# Patient Record
Sex: Female | Born: 2005 | Race: Black or African American | Hispanic: No | Marital: Single | Smoking: Never smoker
Health system: Southern US, Community
[De-identification: ages and names within clinical notes are randomized; demographics above are authoritative.]

## PROBLEM LIST (undated history)

## (undated) DIAGNOSIS — R519 Headache, unspecified: Secondary | ICD-10-CM

## (undated) DIAGNOSIS — R7303 Prediabetes: Secondary | ICD-10-CM

## (undated) DIAGNOSIS — E669 Obesity, unspecified: Secondary | ICD-10-CM

## (undated) DIAGNOSIS — F329 Major depressive disorder, single episode, unspecified: Secondary | ICD-10-CM

## (undated) DIAGNOSIS — L309 Dermatitis, unspecified: Secondary | ICD-10-CM

## (undated) DIAGNOSIS — I10 Essential (primary) hypertension: Secondary | ICD-10-CM

## (undated) DIAGNOSIS — F419 Anxiety disorder, unspecified: Secondary | ICD-10-CM

## (undated) DIAGNOSIS — E739 Lactose intolerance, unspecified: Secondary | ICD-10-CM

## (undated) DIAGNOSIS — M549 Dorsalgia, unspecified: Secondary | ICD-10-CM

## (undated) DIAGNOSIS — F32A Depression, unspecified: Secondary | ICD-10-CM

## (undated) DIAGNOSIS — E282 Polycystic ovarian syndrome: Secondary | ICD-10-CM

## (undated) DIAGNOSIS — K5900 Constipation, unspecified: Secondary | ICD-10-CM

## (undated) HISTORY — DX: Constipation, unspecified: K59.00

## (undated) HISTORY — DX: Anxiety disorder, unspecified: F41.9

## (undated) HISTORY — DX: Dorsalgia, unspecified: M54.9

## (undated) HISTORY — DX: Lactose intolerance, unspecified: E73.9

## (undated) HISTORY — DX: Essential (primary) hypertension: I10

## (undated) HISTORY — DX: Dermatitis, unspecified: L30.9

## (undated) HISTORY — DX: Depression, unspecified: F32.A

## (undated) HISTORY — DX: Prediabetes: R73.03

## (undated) HISTORY — DX: Polycystic ovarian syndrome: E28.2

---
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
## 2024-01-02 ENCOUNTER — Ambulatory Visit (INDEPENDENT_AMBULATORY_CARE_PROVIDER_SITE_OTHER): Admitting: Family Medicine

## 2024-01-02 ENCOUNTER — Encounter (INDEPENDENT_AMBULATORY_CARE_PROVIDER_SITE_OTHER): Payer: Self-pay

## 2024-01-15 ENCOUNTER — Encounter (INDEPENDENT_AMBULATORY_CARE_PROVIDER_SITE_OTHER): Payer: Self-pay | Admitting: Family Medicine

## 2024-01-15 ENCOUNTER — Ambulatory Visit (INDEPENDENT_AMBULATORY_CARE_PROVIDER_SITE_OTHER): Admitting: Family Medicine

## 2024-01-15 VITALS — BP 116/71 | HR 93 | Temp 98.6°F | Ht 65.0 in | Wt 321.0 lb

## 2024-01-15 DIAGNOSIS — F39 Unspecified mood [affective] disorder: Secondary | ICD-10-CM | POA: Diagnosis not present

## 2024-01-15 DIAGNOSIS — Z6841 Body Mass Index (BMI) 40.0 and over, adult: Secondary | ICD-10-CM

## 2024-01-15 DIAGNOSIS — R0602 Shortness of breath: Secondary | ICD-10-CM | POA: Diagnosis not present

## 2024-01-15 DIAGNOSIS — E559 Vitamin D deficiency, unspecified: Secondary | ICD-10-CM

## 2024-01-15 DIAGNOSIS — K5909 Other constipation: Secondary | ICD-10-CM | POA: Diagnosis not present

## 2024-01-15 DIAGNOSIS — F502 Bulimia nervosa, unspecified: Secondary | ICD-10-CM

## 2024-01-15 DIAGNOSIS — Z8659 Personal history of other mental and behavioral disorders: Secondary | ICD-10-CM

## 2024-01-15 DIAGNOSIS — R5383 Other fatigue: Secondary | ICD-10-CM | POA: Diagnosis not present

## 2024-01-15 DIAGNOSIS — R7303 Prediabetes: Secondary | ICD-10-CM

## 2024-01-15 DIAGNOSIS — Z1331 Encounter for screening for depression: Secondary | ICD-10-CM

## 2024-01-15 DIAGNOSIS — F5089 Other specified eating disorder: Secondary | ICD-10-CM | POA: Diagnosis not present

## 2024-01-15 NOTE — Progress Notes (Signed)
 Judy King, D.O.  [facility], [facility] Specializing in Clinical Bariatric Medicine Office located at: [address]  [place], [place]  [address]   Bariatric Medicine Visit  Dear Judy Eleanor GAILS, MD   Thank you for referring Judy King to our clinic today for evaluation.  We performed a consultation to discuss her options for treatment and educate the patient on her disease state.  The following note includes my evaluation and treatment recommendations.   Please do not hesitate to reach out to me directly if you have any further concerns.   Assessment and Plan:   Orders Placed This Encounter  Procedures   VITAMIN D  25 Hydroxy (Vit-D Deficiency, Fractures)   TSH   T4, free   T3   Lipid panel   Insulin , random   Hemoglobin A1c   Folate   Comprehensive metabolic panel with GFR   Vitamin B12   CBC with Differential/Platelet   EKG 12-Lead    FOR THE DISEASE OF OBESITY:  BMI 50.0-59.9, adult (HCC) current 53.4 Morbid obesity Starting [facility]) 53.4 Assessment & Plan: [name] is currently in the action stage of change. As such, her goal is to start our weight management plan.  She has agreed to implement the Category 3 plan  with Breakfast options and only 200 snack calories.  Of note, she is currently at camp and may have some difficulties following her meal plan.    Behavioral Intervention We discussed the following today: begin to work on maintaining a reduced calorie state, getting the recommended amount of protein, incorporating whole foods, making healthy choices, staying well hydrated and practicing mindfulness when eating.  Additional resources provided today: Handout on CAT 3 meal plan and Handout on CAT 3-4 breakfast options  Evidence-based interventions for health behavior change were utilized today including the discussion of self monitoring techniques, problem-solving barriers and SMART goal setting techniques.    Pt will specifically work on trying to  measure her portions of veggies and lean proteins.   Recommended Physical Activity Goals [name] has been advised to work up to 150 minutes of moderate intensity aerobic activity a week and strengthening exercises 2-3 times per week for cardiovascular health, weight loss maintenance and preservation of muscle mass.   She has agreed to maintain current level of activity.    Pharmacotherapy We both agreed to begin nutritional and behavioral strategies   ASSOCIATED CONDITIONS ADDRESSED TODAY:   Fatigue Assessment & Plan: Judy King does feel that her weight is causing her energy to be lower than it should be. Fatigue may be related to obesity, depression or many other causes. she does not appear to have any red flag symptoms and this appears to most likely be related to her current lifestyle habits and dietary intake.  Labs will be ordered and reviewed with her at their next office visit in two weeks.  Epworth sleepiness scale is 11 and appears to not be within normal limits. Judy King admits to daytime somnolence and admits to waking up still tired. Patient has morning headaches 3 times per week. Hiedi generally gets about 8-10 hours of sleep per night, and states that she has generally restful sleep. Snoring is present.  She had a baseline polysomnogram on 09/22/2021.   Impression and recommendations of Dr.Dohmeier:   1. No evidence of Obstructive Sleep Apnea (OSA), nor of Periodic Limb Movement Disorder (PLMD).   - Snoring treatment can be achieved by avoiding the supine position and losing weight. - A dental device can be  used to treat snoring, but medical coverage may be difficult as no apnea diagnosis was established.   She has yet to obtain a dental device and was encouraged to today. Recommend she obtain a body pillow so she can avoid the supine position.    ECG: Performed and reviewed/ interpreted independently.  Normal sinus rhythm, rate 85 bpm; reassuring without any  acute abnormalities, will continue to monitor for symptoms     Shortness of breath on exertion Assessment & Plan: Judy King does feel that she gets out of breath more easily than she used to when she exercises and seems to be worsening over time with weight gain.  This has gotten worse recently. Judy King denies shortness of breath at rest or orthopnea. Pt denies chest pain, dizziness, heart palpitations, or excessive diaphoresis or nausea with activity.  This is not new and is ongoing.  Judy King's shortness of breath appears to be obesity related and exercise induced, as they do not appear to have any red flag symptoms/ concerns today.  Also, this condition appears to be related to a state of poor cardiovascular conditioning   Obtain labs today and will be reviewed with her at their next office visit in two weeks.  Indirect Calorimeter completed today to help guide our dietary regimen. It shows a VO2 of 308 and a REE of 2131.  Her calculated basal metabolic rate is 7733 thus her resting energy expenditure is worse than expected.  Patient agreed to work on weight loss at this time.  As Taje progresses through our weight loss program, we will gradually increase exercise as tolerated to treat her current condition.   If Judy King follows our recommendations and loses 5-10% of their weight without improvement of her shortness of breath or if at any time, symptoms become more concerning, they agree to urgently follow up with their PCP/ specialist for further consideration/ evaluation.   Kenedy verbalizes agreement with this plan.     Mood disorder (HCC) - emotional eating/ Depression Screen  Assessment & Plan:    01/15/2024    7:25 AM 07/22/2022   10:13 AM 12/09/2019    5:31 PM  PHQ9 SCORE ONLY  PHQ-9 Total Score 12 15 5    She endorses having a history of anxiety and depression diagnosed around 6th grade. Was on Fluoxetine   but  later transitioned to Bupropion .. Denies any SI/HI. Mood is  stable today. She endorses emotional eating tendencies specifically when stressed, sad, bored, angry, as a reward, and to help comfort herself. She had a therapist in the past which helped her overcome bulimia nervosa but is no longer seeing one.   Continue Bupropion  300 mg daily. Encouraged to reestablish in therapy for general well-being. Consider Dr.Barker referral in the future if needed. Initiate prudent nutritional plan which can support mood.     Bulimia nervosa, unspecified severity Assessment & Plan: States she was diagnosed with bulimia by a medical doctor in 6th grade. States she was able to overcome bulimia by working with therapists and after starting medications (see Mood disorder section for medications). No incidents since 8th grade. Overall she has no concerns today.     Chronic constipation Assessment & Plan: Due to her history of lactose intolerance, she endorses experiencing constipation when she has a lot of milk products. Continue to avoid lactose products. Start fiber rich prudent nutritional plan.     Prediabetes Assessment & Plan: Lab Results  Component Value Date   HGBA1C 5.7 (H) 06/15/2023   HGBA1C 5.6 06/24/2022  HGBA1C 5.2 12/09/2019    She endorses having a history of pre-diabetes diagnosed around 7th grade. In the past, she was on Metformin  per endocrinology however it was discontinued due to side effects (e.g nausea).   Begin working on nutrition plan to decrease simple carbohydrates, increase lean proteins and exercise to promote weight loss and improve glycemic control and prevent progression to T2DM. Recheck labs.     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 She was on prescription strength VD for a few months at that time. Currently on no supplementation. Will  recheck levels today and provide further guidance at next OV.    FOLLOW UP:   Follow up in 2 weeks. She was informed of the importance of frequent follow up visits to maximize her success with intensive lifestyle modifications for her multiple health conditions.  Caci Orren is aware that we will review all of her lab results at our next visit.  She is aware that if anything is critical/ life threatening with the results, we will be contacting her via MyChart prior to the office visit to discuss management.     Chief Complaint:   OBESITY Suly Vukelich (MR# [id number]) is a pleasant 18 y.o. female who presents for evaluation and treatment of obesity and related comorbidities. Current BMI is Body mass index is 53.42 kg/m. Lasheba Stevens has been struggling with her weight for many years and has been unsuccessful in either losing weight, maintaining weight loss, or reaching her healthy weight goal.  Martena Emanuele is currently in the action stage of change and ready to dedicate time achieving and maintaining a healthier weight. Susi Goslin is interested in becoming our patient and working on intensive lifestyle modifications including (but not limited to) diet and exercise for weight loss.  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  Patient is single and does not have children. She lives with her father and grand-mother.   Gained weight in 2020 because of stress.   Desires to be 180 lbs at some point.   Recently lost 30 lbs in 3 months and kept off weight for 7 months.   Exercise regimen: 150 minutes 2-3 days/week of cardio or wt lifting.   Diets tried in past: Gluten Free, Lactose Free, no carbs/sweets, keto. Nothing worked well for her in the long term.   Eats fast food or take out 3 times per week.   Craves: [place]  [address] and [employer].   Snacks on gummy bears and fruit snacks after dinner.   Skips lunch or dinner 3x/week.    Drinks several caloric beverages including regular sodas 2-3 times per week, juice, sweet tea, fruit smoothies, and protein drinks. Rarely drinks alcohol.   Worst food habit: late night eating after dinner    Subjective:   This is the patient's first visit at [facility].  The patient's NEW PATIENT PACKET that they filled out prior to today's office visit was reviewed at length and information from that paperwork was included within the following office visit note.    Included in the packet: current and past health history, medications, allergies, ROS, gynecologic history (women only), surgical history, family history, social history, weight history, weight loss surgery history (for those that have had  weight loss surgery), nutritional evaluation, mood and food questionnaire along with a depression screening (PHQ9) on all patients, an Epworth questionnaire, sleep habits questionnaire, patient life and health improvement goals questionnaire. These will all be scanned into the patient's chart under the media tab.   Review of Systems: Please refer to new patient packet scanned into media. Pertinent positives were addressed with patient today.  Reviewed by clinician on day of visit: allergies, medications, problem list, medical history, surgical history, family history, social history, and previous encounter notes.  During the visit, I independently reviewed the patient's EKG, bioimpedance scale results, and indirect calorimeter results. I used this information to tailor a meal plan for the patient that will help Lorane Molt to lose weight and will improve her obesity-related conditions going forward.  I performed a medically necessary appropriate examination and/or evaluation. I discussed the assessment and treatment plan with the patient. The patient was provided an opportunity to ask questions and all were answered. The patient agreed with the plan and demonstrated an  understanding of the instructions. Labs were ordered today (unless patient declined them) and will be reviewed with the patient at our next visit unless more critical results need to be addressed immediately. Clinical information was updated and documented in the EMR.    Objective:   PHYSICAL EXAM: Blood pressure 116/71, pulse 93, temperature 98.6 F (37 C), height 5' 5 (1.651 m), weight (!) 321 lb (145.6 kg), SpO2 98%. Body mass index is 53.42 kg/m.  General: Well Developed, well nourished, and in no acute distress.  HEENT: Normocephalic, atraumatic; EOMI, sclerae are anicteric. Skin: Warm and dry, good turgor Chest:  Normal excursion, shape, no gross ABN Respiratory: No conversational dyspnea; speaking in full sentences NeuroM-Sk:  Normal gross ROM * 4 extremities  Psych: A and O *3, insight adequate, mood- full   Anthropometric Measurements Height: 5' 5 (1.651 m) Weight: (!) 321 lb (145.6 kg) BMI (Calculated): 53.42 Weight at Last Visit: 0lb Weight Lost Since Last Visit: 0lb Weight Gained Since Last Visit: 0lb Starting Weight: 321lb Total Weight Loss (lbs): 0 lb (0 kg) Peak Weight: 347lb Waist Measurement : 51 inches   Body Composition  Body Fat %: 53.6 % Fat Mass (lbs): 172 lbs Muscle Mass (lbs): 141.6 lbs Visceral Fat Rating : 17   Other Clinical Data RMR: 2131 Fasting: Yes Labs: Yes Today's Visit #: 1 Starting Date: 01/15/24 Comments: First Visit    DIAGNOSTIC DATA REVIEWED:  BMET    Component Value Date/Time   NA 139 06/15/2023 1112   K 4.3 06/15/2023 1112   CL 104 06/15/2023 1112   CO2 28 06/15/2023 1112   GLUCOSE 90 06/15/2023 1112   BUN 10 06/15/2023 1112   CREATININE 0.64 06/15/2023 1112   CALCIUM 9.7 06/15/2023 1112   GFRNONAA NOT CALCULATED 07/11/2020 0306   Lab Results  Component Value Date   HGBA1C 5.7 (H) 06/15/2023   HGBA1C 5.2 12/09/2019   No results found for: INSULIN  No results found for: TSH CBC    Component Value  Date/Time   WBC 9.2 06/15/2023 1112   RBC 4.47 06/15/2023 1112   HGB 12.0 06/15/2023 1112   HCT 37.7 06/15/2023 1112   PLT 520 (H) 06/15/2023 1112   MCV 84.3 06/15/2023 1112   MCH 26.8 06/15/2023 1112   MCHC 31.8 06/15/2023 1112   RDW 14.1 06/15/2023 1112   Iron Studies No results found for: IRON, TIBC, FERRITIN, IRONPCTSAT Lipid Panel     Component Value Date/Time  CHOL 179 (H) 06/15/2023 1112   TRIG 114 (H) 06/15/2023 1112   HDL 48 06/15/2023 1112   CHOLHDL 3.7 06/15/2023 1112   LDLCALC 109 06/15/2023 1112   Hepatic Function Panel     Component Value Date/Time   PROT 7.1 06/15/2023 1112   AST 19 06/15/2023 1112   ALT 25 06/15/2023 1112   BILITOT 0.2 06/15/2023 1112   No results found for: TSH Nutritional Lab Results  Component Value Date   VD25OH 30 06/15/2023   VD25OH 38 06/24/2022   VD25OH 16 (L) 12/09/2019    Attestation Statements:   I, Special Puri , acting as a Stage manager for Judy Jenkins, DO., have compiled all relevant documentation for today's office visit on behalf of Judy Jenkins, DO, while in the presence of [employer], DO.  I have spent 61 minutes in the care of the patient today including 51 minutes face-to-face assessing and reviewing listed medical problems above as outlined in office visit note, providing nutritional and behavioral counseling as outlined in obesity care plan, independently interpreting results and goals of care, see listed medical problems, and discussing biometric information and progress.   I have reviewed the above documentation for accuracy and completeness, and I agree with the above. Judy JINNY King, D.O.  The 21st Century Cures Act was signed into law in 2016 which includes the topic of electronic health records.  This provides immediate access to information in MyChart.  This includes consultation notes, operative notes, office notes, lab results and pathology reports.  If you have any questions  about what you read please let us  know at your next visit so we can discuss your concerns and take corrective action if need be.  We are right here with you.

## 2024-01-16 LAB — COMPREHENSIVE METABOLIC PANEL WITH GFR
ALT: 20 IU/L (ref 0–32)
AST: 21 IU/L (ref 0–40)
Albumin: 4.1 g/dL (ref 4.0–5.0)
Alkaline Phosphatase: 77 IU/L (ref 42–106)
BUN/Creatinine Ratio: 14 (ref 9–23)
BUN: 10 mg/dL (ref 6–20)
Bilirubin Total: <0.2 mg/dL (ref 0.0–1.2)
CO2: 16 mmol/L — ABNORMAL LOW (ref 20–29)
Calcium: 9.7 mg/dL (ref 8.7–10.2)
Chloride: 105 mmol/L (ref 96–106)
Creatinine, Ser: 0.71 mg/dL (ref 0.57–1.00)
Globulin, Total: 2.8 g/dL (ref 1.5–4.5)
Glucose: 84 mg/dL (ref 70–99)
Potassium: 4.4 mmol/L (ref 3.5–5.2)
Sodium: 140 mmol/L (ref 134–144)
Total Protein: 6.9 g/dL (ref 6.0–8.5)
eGFR: 126 mL/min/1.73

## 2024-01-16 LAB — FOLATE: Folate: >20 ng/mL (ref >3.0)

## 2024-01-16 LAB — CBC WITH DIFFERENTIAL/PLATELET
Basophils Absolute: 0 x10E3/uL (ref 0.0–0.2)
Basos: 0 %
EOS (ABSOLUTE): 0.2 x10E3/uL (ref 0.0–0.4)
Eos: 2 %
Hematocrit: 39.4 % (ref 34.0–46.6)
Hemoglobin: 11.9 g/dL (ref 11.1–15.9)
Immature Grans (Abs): 0 x10E3/uL (ref 0.0–0.1)
Immature Granulocytes: 0 %
Lymphocytes Absolute: 1.7 x10E3/uL (ref 0.7–3.1)
Lymphs: 21 %
MCH: 26.8 pg (ref 26.6–33.0)
MCHC: 30.2 g/dL — ABNORMAL LOW (ref 31.5–35.7)
MCV: 89 fL (ref 79–97)
Monocytes Absolute: 0.7 x10E3/uL (ref 0.1–0.9)
Monocytes: 8 %
Neutrophils Absolute: 5.7 x10E3/uL (ref 1.4–7.0)
Neutrophils: 69 %
Platelets: 475 x10E3/uL — ABNORMAL HIGH (ref 150–450)
RBC: 4.44 x10E6/uL (ref 3.77–5.28)
RDW: 14 % (ref 11.7–15.4)
WBC: 8.4 x10E3/uL (ref 3.4–10.8)

## 2024-01-16 LAB — INSULIN, RANDOM: INSULIN: 37.7 u[IU]/mL — ABNORMAL HIGH (ref 2.6–24.9)

## 2024-01-16 LAB — T3: T3, Total: 140 ng/dL (ref 71–180)

## 2024-01-16 LAB — HEMOGLOBIN A1C
Est. average glucose Bld gHb Est-mCnc: 105 mg/dL
Hgb A1c MFr Bld: 5.3 % (ref 4.8–5.6)

## 2024-01-16 LAB — TSH: TSH: 1.96 u[IU]/mL (ref 0.450–4.500)

## 2024-01-16 LAB — LIPID PANEL
Chol/HDL Ratio: 4.2 ratio (ref 0.0–4.4)
Cholesterol, Total: 200 mg/dL — ABNORMAL HIGH (ref 100–169)
HDL: 48 mg/dL
LDL Chol Calc (NIH): 135 mg/dL — ABNORMAL HIGH (ref 0–109)
Triglycerides: 94 mg/dL — ABNORMAL HIGH (ref 0–89)
VLDL Cholesterol Cal: 17 mg/dL (ref 5–40)

## 2024-01-16 LAB — VITAMIN D 25 HYDROXY (VIT D DEFICIENCY, FRACTURES): Vit D, 25-Hydroxy: 32.5 ng/mL (ref 30.0–100.0)

## 2024-01-16 LAB — T4, FREE: Free T4: 0.87 ng/dL — ABNORMAL LOW (ref 0.93–1.60)

## 2024-01-16 LAB — VITAMIN B12: Vitamin B-12: 310 pg/mL (ref 232–1245)

## 2024-01-29 ENCOUNTER — Ambulatory Visit (INDEPENDENT_AMBULATORY_CARE_PROVIDER_SITE_OTHER): Admitting: Family Medicine

## 2024-01-29 ENCOUNTER — Encounter (INDEPENDENT_AMBULATORY_CARE_PROVIDER_SITE_OTHER): Payer: Self-pay | Admitting: Family Medicine

## 2024-01-29 VITALS — BP 116/76 | HR 73 | Temp 98.4°F | Ht 65.0 in | Wt 323.0 lb

## 2024-01-29 DIAGNOSIS — E559 Vitamin D deficiency, unspecified: Secondary | ICD-10-CM

## 2024-01-29 DIAGNOSIS — E038 Other specified hypothyroidism: Secondary | ICD-10-CM

## 2024-01-29 DIAGNOSIS — Z6841 Body Mass Index (BMI) 40.0 and over, adult: Secondary | ICD-10-CM

## 2024-01-29 DIAGNOSIS — R7303 Prediabetes: Secondary | ICD-10-CM

## 2024-01-29 DIAGNOSIS — E538 Deficiency of other specified B group vitamins: Secondary | ICD-10-CM

## 2024-01-29 DIAGNOSIS — E782 Mixed hyperlipidemia: Secondary | ICD-10-CM

## 2024-01-29 NOTE — Progress Notes (Signed)
 Judy King, D.O.  ABFM, ABOM Clinical Bariatric Medicine Physician  Office located at: [address] [place], [place]  [address]   Assessment and Plan:   *** No orders of the defined types were placed in this encounter.   There are no discontinued medications.   No orders of the defined types were placed in this encounter.   FOR THE DISEASE OF OBESITY:  BMI 50.0-59.9, adult (HCC) current 53.8 Morbid obesity Starting [facility]) 53.4 Assessment & Plan: Since last office visit on 01/15/2024 patient's muscle mass has increased by 1.2  lbs. Fat mass has increased by 1  lbs. Total body water is 118 lbs.  Counseling done on how various foods will affect these numbers and how to maximize success  Total lbs lost to date: + 2 lbs Total weight loss percentage to date: + 0.62 %   Recommended Dietary Goals Kytzia is currently in the action stage of change. As such, her goal is to continue weight management plan.  She has agreed to begin journaling 1550-1650 cal and 120++  grams protein daily with the CAT 3 MP (only 200 snack cal) as a guide.    Behavioral Intervention We discussed the following today: increasing lean protein intake to established goals, increasing water intake , work on tracking and journaling calories using tracking application, and focusing on food with a 10:1 ratio of calories: grams of protein  Additional resources provided today: Handout on protein content of various foods, Handout on general tips for eating healthy, Handout on I.R and Pre-DM education.   Evidence-based interventions for health behavior change were utilized today including the discussion of self monitoring techniques, problem-solving barriers and SMART goal setting techniques.  Regarding patient's less desirable eating habits and patterns, we employed the technique of small changes.   Pt will specifically work on: n/a   Recommended Physical Activity Goals Carrieanne has been advised to  work up to 150 minutes of moderate intensity aerobic activity a week and strengthening exercises 2-3 times per week for cardiovascular health, weight loss maintenance and preservation of muscle mass.   She has agreed to : Continue current level of physical activity    Pharmacotherapy Continue with current nutritional and behavioral strategies   ASSOCIATED CONDITIONS ADDRESSED TODAY:   Mixed hyperlipidemia - new onset Assessment & Plan: Lab Results  Component Value Date   CHOL 200 (H) 01/15/2024   HDL 48 01/15/2024   LDLCALC 135 (H) 01/15/2024   TRIG 94 (H) 01/15/2024   CHOLHDL 4.2 01/15/2024   Her Triglycerides are slightly elevated at 94. LDL is also elevated at 135. HDL is WNL.   - Diagnosis was reviewed with the patient and education was provided.  - Continue to work on nutrition plan -decreasing simple carbohydrates, increasing lean proteins, decreasing saturated fats and cholesterol , avoiding trans fats and exercise as able to promote weight loss and improve lipids.     Prediabetes Assessment & Plan: Lab Results  Component Value Date   HGBA1C 5.3 01/15/2024   HGBA1C 5.7 (H) 06/15/2023   HGBA1C 5.6 06/24/2022   INSULIN  37.7 (H) 01/15/2024   Lab Results  Component Value Date   FOLATE >20.0 01/15/2024   Lab Results  Component Value Date   CREATININE 0.71 01/15/2024   BUN 10 01/15/2024   NA 140 01/15/2024   K 4.4 01/15/2024   CL 105 01/15/2024   CO2 16 (L) 01/15/2024      Component Value Date/Time   PROT 6.9 01/15/2024 0916  ALBUMIN 4.1 01/15/2024 0916   AST 21 01/15/2024 0916   ALT 20 01/15/2024 0916   ALKPHOS 77 01/15/2024 0916   BILITOT <0.2 01/15/2024 0916    Lab Results  Component Value Date   WBC 8.4 01/15/2024   HGB 11.9 01/15/2024   HCT 39.4 01/15/2024   MCV 89 01/15/2024   PLT 475 (H) 01/15/2024   Was on Metformin  for pre-DM per endocrinology in the past for about a year. She experienced side effects (nausea and vomiting) for 3-4 months  and so the Metformin  was discontinued. These side effects were felt despite her eating healthy.   Lab wise, her fasting insulin  is roughly 7 times normal. Blood counts do not appear to show evidence of anemia. Hemoglobin A1c, folate, kidney and liver function are WNL.  - Patient aware of disease state and risk of progression.  - Educational handout on pre-dm and I.R provided.  - Begin journaling intake with an emphasis on decreasing simple carbs/ sugars; increasing fiber and proteins  - Ongoing reduction in adipose tissue will improve insulin  resistance.  - CO2 will likely improve with increased cardiovascular fitness.      Subclinical hypothyroidism - new onset Assessment & Plan: Lab Results  Component Value Date   TSH 1.960 01/15/2024   FREET4 0.87 (L) 01/15/2024   T3TOTAL 140 01/15/2024   Her T3 and TSH are normal. Free T4 was slightly low at 0.87.   - Diagnosis was reviewed with the patient. - Recommend she f/up with PCP and or endocrinology for further care.     B12 deficiency - new onset Assessment & Plan: Lab Results  Component Value Date   VITAMINB12 310 01/15/2024   B12 level is 310, not at goal of over 500.    - Diagnosis was reviewed with the patient and education was provided.  - Will hold off on B12 supplementation for now.  - Continue  prudent nutritional plan and focus on b12 rich foods such as lean red meats; poultry; eggs; seafood; beans, peas, and lentils; nuts and seeds; and soy products - Recheck 3-4 months.     Vitamin D  deficiency Assessment & Plan: Lab Results  Component Value Date   VD25OH 32.5 01/15/2024   VD25OH 30 06/15/2023   VD25OH 38 06/24/2022   She endorses having a history of Vit D deficiency diagnosed around 5th grade. She was on prescription strength Vit D for a few months at that time. Currently on no supplementation. Her Vitamin D  levels are not at goal of 50 to 70.   - Discussed the importance of vitamin D  to the patient's  health and well-being as well as to their ability to lose weight.  - Pt is agreeable to starting Ergocaliferol 50,000 units weekly.  - Recheck 3-4 months.     FOLLOW UP:   Return 02/12/2024 at 8:20 AM. She was informed of the importance of frequent follow up visits to maximize her success with intensive lifestyle modifications for her multiple health conditions.   Subjective:   Chief complaint: Obesity Anjenette is here to discuss her progress with her obesity treatment plan. She is on the Category 3 Plan with Breakfast Options and only 200 snack calories and states she is following her eating plan approximately 25 % of the time. She states she is walking 60 minutes 5 days per week.   Interval History:  Marianna Cid is here today for her first follow-up office visit since starting the program with us . Since last OV, pt is  up 2 lbs. She felt the meal plan was a huge jump for her. She does not drink milk and dislikes certain foods like cottage cheese. Did not use her snack calories. Of note, she is currently working at an [employer] Sun-Fri and sometimes skips meals due to her very busy schedule. She has 3 weeks left at camp before returning to [employer].   Pharmacotherapy that aid with weight loss: Was on Metformin  for pre-DM in the past for about a year. She experienced side effects (nausea and vomiting) for 3-4 months and so the Metformin  was discontinued. These side effects were felt despite her eating healthy.    Review of Systems:  Pertinent positives were addressed with patient today.   Reviewed by clinician on day of visit: allergies, medications, problem list, medical history, surgical history, family history, social history, and previous encounter notes.  Weight Summary and Biometrics   Weight Lost Since Last Visit: 0lb  Weight Gained Since Last Visit: 2lb   Vitals Temp: 98.4 F (36.9 C) BP: 116/76 Pulse Rate: 73 SpO2: 98 %   Anthropometric Measurements Height:  5' 5 (1.651 m) Weight: (!) 323 lb (146.5 kg) BMI (Calculated): 53.75 Weight at Last Visit: 321lb Weight Lost Since Last Visit: 0lb Weight Gained Since Last Visit: 2lb Starting Weight: 321lb Total Weight Loss (lbs): 0 lb (0 kg) Peak Weight: 347lb Waist Measurement : 51 inches   Body Composition  Body Fat %: 53.5 % Fat Mass (lbs): 173 lbs Muscle Mass (lbs): 142.8 lbs Total Body Water (lbs): 118 lbs Visceral Fat Rating : 17   Other Clinical Data RMR: 2131 Fasting: No Labs: no Today's Visit #: 2 Starting Date: 01/15/24 Comments: Cat 3    Objective:   PHYSICAL EXAM:  Blood pressure 116/76, pulse 73, temperature 98.4 F (36.9 C), height 5' 5 (1.651 m), weight (!) 323 lb (146.5 kg), SpO2 98%. Body mass index is 53.75 kg/m.  General: she is overweight, cooperative and in no acute distress.   HEENT: EOMI, sclerae are anicteric. Lungs: Normal breathing effort, no conversational dyspnea. M-Sk:  Normal gross ROM * 4 extremities  PSYCH: Has normal mood, affect and thought process. Neurologic: No gross sensory or motor deficits. Well developed, A and O * 3  DIAGNOSTIC DATA REVIEWED:  BMET    Component Value Date/Time   NA 140 01/15/2024 0916   K 4.4 01/15/2024 0916   CL 105 01/15/2024 0916   CO2 16 (L) 01/15/2024 0916   GLUCOSE 84 01/15/2024 0916   GLUCOSE 90 06/15/2023 1112   BUN 10 01/15/2024 0916   CREATININE 0.71 01/15/2024 0916   CREATININE 0.64 06/15/2023 1112   CALCIUM 9.7 01/15/2024 0916   GFRNONAA NOT CALCULATED 07/11/2020 0306   Lab Results  Component Value Date   HGBA1C 5.3 01/15/2024   HGBA1C 5.2 12/09/2019   Lab Results  Component Value Date   INSULIN  37.7 (H) 01/15/2024   Lab Results  Component Value Date   TSH 1.960 01/15/2024   CBC    Component Value Date/Time   WBC 8.4 01/15/2024 0916   WBC 9.2 06/15/2023 1112   RBC 4.44 01/15/2024 0916   RBC 4.47 06/15/2023 1112   HGB 11.9 01/15/2024 0916   HCT 39.4 01/15/2024 0916   PLT 475  (H) 01/15/2024 0916   MCV 89 01/15/2024 0916   MCH 26.8 01/15/2024 0916   MCH 26.8 06/15/2023 1112   MCHC 30.2 (L) 01/15/2024 0916   MCHC 31.8 06/15/2023 1112   RDW 14.0 01/15/2024 0916  Iron Studies No results found for: IRON, TIBC, FERRITIN, IRONPCTSAT Lipid Panel     Component Value Date/Time   CHOL 200 (H) 01/15/2024 0916   TRIG 94 (H) 01/15/2024 0916   HDL 48 01/15/2024 0916   CHOLHDL 4.2 01/15/2024 0916   CHOLHDL 3.7 06/15/2023 1112   LDLCALC 135 (H) 01/15/2024 0916   LDLCALC 109 06/15/2023 1112   Hepatic Function Panel     Component Value Date/Time   PROT 6.9 01/15/2024 0916   ALBUMIN 4.1 01/15/2024 0916   AST 21 01/15/2024 0916   ALT 20 01/15/2024 0916   ALKPHOS 77 01/15/2024 0916   BILITOT <0.2 01/15/2024 0916      Component Value Date/Time   TSH 1.960 01/15/2024 0916   Nutritional Lab Results  Component Value Date   VD25OH 32.5 01/15/2024   VD25OH 30 06/15/2023   VD25OH 38 06/24/2022    Attestations:   I, Special Puri, acting as a Stage manager for Judy Jenkins, DO., have compiled all relevant documentation for today's office visit on behalf of Judy Jenkins, DO, while in the presence of [employer], DO.  I have spent 66 minutes in the care of the patient today including 56 minutes was spent on face-to-face counseling and reviewing listed medical problems above as outlined in office visit note, providing nutritional and behavioral counseling as outlined in obesity care plan, independently interpreting results and goals of care, see listed medical problems, and discussing biometric information and progress. We reviewed her meal plan and discussed how the foods she's eating is affecting each one of her labs. Pt educated on why we want her to eat various foods in various amounts and has a better understanding of the nutritional plan because of this. All her questions were answered today.   I have reviewed the above documentation for accuracy  and completeness, and I agree with the above. Judy JINNY King, D.O.  The 21st Century Cures Act was signed into law in 2016 which includes the topic of electronic health records.  This provides immediate access to information in MyChart.  This includes consultation notes, operative notes, office notes, lab results and pathology reports.  If you have any questions about what you read please let us  know at your next visit so we can discuss your concerns and take corrective action if need be.  We are right here with you.

## 2024-02-04 MED ORDER — VITAMIN D (ERGOCALCIFEROL) 1.25 MG (50000 UNIT) PO CAPS: 50000.0000 [IU] | ORAL_CAPSULE | ORAL | 0 refills | Status: DC

## 2024-02-12 ENCOUNTER — Encounter (INDEPENDENT_AMBULATORY_CARE_PROVIDER_SITE_OTHER): Payer: Self-pay | Admitting: Family Medicine

## 2024-02-12 ENCOUNTER — Ambulatory Visit (INDEPENDENT_AMBULATORY_CARE_PROVIDER_SITE_OTHER): Admitting: Family Medicine

## 2024-02-12 VITALS — BP 135/83 | HR 84 | Temp 98.0°F | Ht 65.0 in | Wt 319.0 lb

## 2024-02-12 DIAGNOSIS — Z6841 Body Mass Index (BMI) 40.0 and over, adult: Secondary | ICD-10-CM | POA: Diagnosis not present

## 2024-02-12 DIAGNOSIS — E559 Vitamin D deficiency, unspecified: Secondary | ICD-10-CM

## 2024-02-12 DIAGNOSIS — R7303 Prediabetes: Secondary | ICD-10-CM | POA: Diagnosis not present

## 2024-02-12 NOTE — Progress Notes (Signed)
 Judy King, D.O.  ABFM, ABOM Specializing in Clinical Bariatric Medicine  Office located at: [address] [place], [place]  [address]   Assessment and Plan:   Medications Discontinued During This Encounter  Medication Reason   Cholecalciferol (VITAMIN D ) 50 MCG (2000 UT) CAPS      FOR THE DISEASE OF OBESITY:  BMI 50.0-59.9, adult (HCC) current 53.08 Morbid obesity Starting [facility]) 53.4 Assessment & Plan: Since last office visit on 01/29/2024 patient's muscle mass has decreased by 1.2 lbs. Fat mass has decreased by 2.2 lbs. Total body water has decreased by 2.7 lbs.  Body fat % has decreased by 0.1 %. Counseling done on how various foods will affect these numbers and how to maximize success  Total lbs lost to date: -2 lbs Total weight loss percentage to date: -0.62 %   Recommended Dietary Goals Judy King is currently in the action stage of change. As such, her goal is to continue weight management plan.  She has agreed to: continue current plan   Behavioral Intervention We discussed the following today: increasing whole foods, protein speeds metabolism, increasing lean protein intake to established goals, work on tracking and journaling calories using tracking application, and focusing on food with a 10:1 ratio of calories: grams of protein  Additional resources provided today: Handout on balanced plate concepts.  , Handout on complex carbohydrates and lean sources of protein, Handout on reduced calorie nutrition plan concepts, Handout on CAT 3 meal plan, Handout on Healthy [employer] Recipe , and Handout on Daily Food Journaling Log  Evidence-based interventions for health behavior change were utilized today including the discussion of self monitoring techniques, problem-solving barriers and SMART goal setting techniques.   Regarding patient's less desirable eating habits and patterns, we employed the technique of small changes.   Pt will specifically work on:  n/a   Recommended Physical Activity Goals Judy King has been advised to work up to 300-450 minutes of moderate intensity aerobic activity a week and strengthening exercises 2-3 times per week for cardiovascular health, weight loss maintenance and preservation of muscle mass.   She may continue to gradually increase the amount and intensity of exercise routine   Pharmacotherapy Continue with current nutritional and behavioral strategies   ASSOCIATED CONDITIONS ADDRESSED TODAY:   Prediabetes Assessment & Plan: Lab Results  Component Value Date   HGBA1C 5.3 01/15/2024   HGBA1C 5.7 (H) 06/15/2023   HGBA1C 5.6 06/24/2022   INSULIN  37.7 (H) 01/15/2024    Pre-diabetes managed with dietary and life-style interventions. Pt endorses that recently she has been getting really full after 2 bites of food and will then feel sick if she eats more. Recommend pt contact PCP regarding this new onset issue. Continue all nutritional and behavioral strategies. Advance exercise as able.     Vitamin D  deficiency Assessment & Plan: Lab Results  Component Value Date   VD25OH 32.5 01/15/2024   VD25OH 30 06/15/2023   VD25OH 38 06/24/2022   Her Vitamin D  levels are not at goal of 50 to 70. She did not start the strength Vit D which I prescribed last OV.   Again discussed the importance of vitamin D  to the patient's health and well-being as well as to their ability to lose weight. Pt is agreeable to starting Ergocaliferol 50,000 units weekly. Recheck 3-4 months    Follow up:   Return 03/14/2024 at 12:00 PM with Verdon Parry D, MD.  She was informed of the importance of frequent follow up  visits to maximize her success with intensive lifestyle modifications for her multiple health conditions.   Subjective:    Chief complaint: Obesity Judy King is here to discuss her progress with her obesity treatment plan. She is journaling 1550-1650 cal and 120++  grams protein daily with the CAT 3 MP (only  200 snack cal) as a guide and states she is following her eating plan approximately 25% of the time. She states she is walking 5 minutes 6 days per week.   Interval History:  Judy King is here for a follow up office visit. She is accompanied by her father today. Since last OV on 01/29/2024 , she is down 4 lbs. She is journaling her intake through [employer] Pt endorses that recently she has been getting really full after 2 bites of food and will then feel sick if she eats more.    Pharmacotherapy that aid with weight loss: n/a   Review of Systems:  Pertinent positives were addressed with patient today.  Reviewed by clinician on day of visit: allergies, medications, problem list, medical history, surgical history, family history, social history, and previous encounter notes.  Weight Summary and Biometrics   Weight Lost Since Last Visit: 4lb  Weight Gained Since Last Visit: 0    Vitals Temp: 98 F (36.7 C) BP: 135/83 Pulse Rate: 84 SpO2: 99 %   Anthropometric Measurements Height: 5' 5 (1.651 m) Weight: (!) 319 lb (144.7 kg) BMI (Calculated): 53.08 Weight at Last Visit: 323lb Weight Lost Since Last Visit: 4lb Weight Gained Since Last Visit: 0 Starting Weight: 321lb Total Weight Loss (lbs): 2 lb (0.907 kg) Peak Weight: 347lb Waist Measurement : 51 inches   Body Composition  Body Fat %: 53.4 % Fat Mass (lbs): 170.8 lbs Muscle Mass (lbs): 141.6 lbs Total Body Water (lbs): 115.2 lbs Visceral Fat Rating : 17   Other Clinical Data Fasting: no Labs: no Today's Visit #: 3 Starting Date: 01/15/24   Objective:   PHYSICAL EXAM: Blood pressure 135/83, pulse 84, temperature 98 F (36.7 C), height 5' 5 (1.651 m), weight (!) 319 lb (144.7 kg), last menstrual period 01/29/2024, SpO2 99%. Body mass index is 53.08 kg/m.  General: she is overweight, cooperative and in no acute distress. PSYCH: Has normal mood, affect and thought process.   HEENT: EOMI, sclerae are  anicteric. Lungs: Normal breathing effort, no conversational dyspnea. Extremities: Moves * 4 Neurologic: A and O * 3, good insight  DIAGNOSTIC DATA REVIEWED: BMET    Component Value Date/Time   NA 140 01/15/2024 0916   K 4.4 01/15/2024 0916   CL 105 01/15/2024 0916   CO2 16 (L) 01/15/2024 0916   GLUCOSE 84 01/15/2024 0916   GLUCOSE 90 06/15/2023 1112   BUN 10 01/15/2024 0916   CREATININE 0.71 01/15/2024 0916   CREATININE 0.64 06/15/2023 1112   CALCIUM 9.7 01/15/2024 0916   GFRNONAA NOT CALCULATED 07/11/2020 0306   Lab Results  Component Value Date   HGBA1C 5.3 01/15/2024   HGBA1C 5.2 12/09/2019   Lab Results  Component Value Date   INSULIN  37.7 (H) 01/15/2024   Lab Results  Component Value Date   TSH 1.960 01/15/2024   CBC    Component Value Date/Time   WBC 8.4 01/15/2024 0916   WBC 9.2 06/15/2023 1112   RBC 4.44 01/15/2024 0916   RBC 4.47 06/15/2023 1112   HGB 11.9 01/15/2024 0916   HCT 39.4 01/15/2024 0916   PLT 475 (H) 01/15/2024 0916   MCV 89 01/15/2024  0916   MCH 26.8 01/15/2024 0916   MCH 26.8 06/15/2023 1112   MCHC 30.2 (L) 01/15/2024 0916   MCHC 31.8 06/15/2023 1112   RDW 14.0 01/15/2024 0916   Iron Studies No results found for: IRON, TIBC, FERRITIN, IRONPCTSAT Lipid Panel     Component Value Date/Time   CHOL 200 (H) 01/15/2024 0916   TRIG 94 (H) 01/15/2024 0916   HDL 48 01/15/2024 0916   CHOLHDL 4.2 01/15/2024 0916   CHOLHDL 3.7 06/15/2023 1112   LDLCALC 135 (H) 01/15/2024 0916   LDLCALC 109 06/15/2023 1112   Hepatic Function Panel     Component Value Date/Time   PROT 6.9 01/15/2024 0916   ALBUMIN 4.1 01/15/2024 0916   AST 21 01/15/2024 0916   ALT 20 01/15/2024 0916   ALKPHOS 77 01/15/2024 0916   BILITOT <0.2 01/15/2024 0916      Component Value Date/Time   TSH 1.960 01/15/2024 0916   Nutritional Lab Results  Component Value Date   VD25OH 32.5 01/15/2024   VD25OH 30 06/15/2023   VD25OH 38 06/24/2022     Attestations:   I, Special Puri, acting as a Stage manager for Judy Jenkins, DO., have compiled all relevant documentation for today's office visit on behalf of Judy Jenkins, DO, while in the presence of [employer], DO.  I have spent 40 minutes in the care of the patient today including 30 minutes face-to-face assessing and reviewing listed medical problems above as outlined in office visit note and providing nutritional and behavioral counseling as outlined in obesity care plan.   I have reviewed the above documentation for accuracy and completeness, and I agree with the above. Judy JINNY King, D.O.  The 21st Century Cures Act was signed into law in 2016 which includes the topic of electronic health records.  This provides immediate access to information in MyChart.  This includes consultation notes, operative notes, office notes, lab results and pathology reports.  If you have any questions about what you read please let us  know at your next visit so we can discuss your concerns and take corrective action if need be.  We are right here with you.

## 2024-03-14 ENCOUNTER — Ambulatory Visit (INDEPENDENT_AMBULATORY_CARE_PROVIDER_SITE_OTHER): Admitting: Family Medicine

## 2024-03-14 ENCOUNTER — Encounter (INDEPENDENT_AMBULATORY_CARE_PROVIDER_SITE_OTHER): Payer: Self-pay | Admitting: Family Medicine

## 2024-03-14 VITALS — BP 117/82 | HR 91 | Temp 98.0°F | Ht 65.0 in | Wt 315.0 lb

## 2024-03-14 DIAGNOSIS — E559 Vitamin D deficiency, unspecified: Secondary | ICD-10-CM | POA: Diagnosis not present

## 2024-03-14 DIAGNOSIS — A059 Bacterial foodborne intoxication, unspecified: Secondary | ICD-10-CM

## 2024-03-14 DIAGNOSIS — K529 Noninfective gastroenteritis and colitis, unspecified: Secondary | ICD-10-CM

## 2024-03-14 DIAGNOSIS — Z6841 Body Mass Index (BMI) 40.0 and over, adult: Secondary | ICD-10-CM

## 2024-03-14 DIAGNOSIS — E669 Obesity, unspecified: Secondary | ICD-10-CM

## 2024-03-14 MED ORDER — VITAMIN D (ERGOCALCIFEROL) 1.25 MG (50000 UNIT) PO CAPS: 50000.0000 [IU] | ORAL_CAPSULE | ORAL | 0 refills | Status: DC

## 2024-03-14 MED ORDER — WEGOVY 0.25 MG/0.5ML ~~LOC~~ SOAJ: 0.2500 mg | SUBCUTANEOUS | 0 refills | Status: DC

## 2024-03-14 NOTE — Addendum Note (Signed)
 Addended by: LAFE BAKER CROME on: 03/14/2024 02:15 PM   Modules accepted: Level of Service

## 2024-03-14 NOTE — Progress Notes (Signed)
 Office: [phone]  /  Fax: [phone]  WEIGHT SUMMARY AND BIOMETRICS  Anthropometric Measurements Height: 5' 5 (1.651 m) Weight: (!) 315 lb (142.9 kg) BMI (Calculated): 52.42 Weight at Last Visit: 319 lb Weight Lost Since Last Visit: 4 lb Weight Gained Since Last Visit: 0 Starting Weight: 321 lb Total Weight Loss (lbs): 6 lb (2.722 kg) Peak Weight: 347 lb Waist Measurement : 51 inches   Body Composition  Body Fat %: 47.8 % Fat Mass (lbs): 150.8 lbs Muscle Mass (lbs): 156.4 lbs Total Body Water (lbs): 110 lbs Visceral Fat Rating : 15   Other Clinical Data RMR: 2131 Fasting: no Labs: no Today's Visit #: 4 Starting Date: 01/15/24 Comments: cat 3    Chief Complaint: OBESITY   History of Present Illness Judy King is a 18 year old female who presents for a follow-up on her weight management treatment.  She is following a category three eating plan, adhering to it about fifty percent of the time. Her exercise routine includes cardio and strengthening activities four days a week for sixty minutes each session. Since her last visit, she has lost four pounds. She is considering joining [employer] and is concerned about not losing weight as quickly as desired despite her efforts. She is interested in GLP-1 medications for weight loss, as she struggles with taking pills like metformin .  Recently, she experienced food poisoning after eating at a [employer] in [place], where she attends school at [employer]. Symptoms began on Sunday and have persisted for four days, though she feels significantly better now. Initially, she was unable to keep any food down and required hospitalization on Monday night for fluids and anti-nausea medication. She continues to experience nausea and occasional vomiting but is able to hydrate. During a three-hour drive yesterday, she had to pull over three times due to nausea.  She has a supportive network, including a roommate who is a  Engineer, civil (consulting), and friends who are on GLP-1 medications.      PHYSICAL EXAM:  Blood pressure 117/82, pulse 91, temperature 98 F (36.7 C), height 5' 5 (1.651 m), weight (!) 315 lb (142.9 kg), SpO2 99%. Body mass index is 52.42 kg/m.  DIAGNOSTIC DATA REVIEWED:  BMET    Component Value Date/Time   NA 140 01/15/2024 0916   K 4.4 01/15/2024 0916   CL 105 01/15/2024 0916   CO2 16 (L) 01/15/2024 0916   GLUCOSE 84 01/15/2024 0916   GLUCOSE 90 06/15/2023 1112   BUN 10 01/15/2024 0916   CREATININE 0.71 01/15/2024 0916   CREATININE 0.64 06/15/2023 1112   CALCIUM 9.7 01/15/2024 0916   GFRNONAA NOT CALCULATED 07/11/2020 0306   Lab Results  Component Value Date   HGBA1C 5.3 01/15/2024   HGBA1C 5.2 12/09/2019   Lab Results  Component Value Date   INSULIN  37.7 (H) 01/15/2024   Lab Results  Component Value Date   TSH 1.960 01/15/2024   CBC    Component Value Date/Time   WBC 8.4 01/15/2024 0916   WBC 9.2 06/15/2023 1112   RBC 4.44 01/15/2024 0916   RBC 4.47 06/15/2023 1112   HGB 11.9 01/15/2024 0916   HCT 39.4 01/15/2024 0916   PLT 475 (H) 01/15/2024 0916   MCV 89 01/15/2024 0916   MCH 26.8 01/15/2024 0916   MCH 26.8 06/15/2023 1112   MCHC 30.2 (L) 01/15/2024 0916   MCHC 31.8 06/15/2023 1112   RDW 14.0 01/15/2024 0916   Iron Studies No results found for: IRON, TIBC,  FERRITIN, IRONPCTSAT Lipid Panel     Component Value Date/Time   CHOL 200 (H) 01/15/2024 0916   TRIG 94 (H) 01/15/2024 0916   HDL 48 01/15/2024 0916   CHOLHDL 4.2 01/15/2024 0916   CHOLHDL 3.7 06/15/2023 1112   LDLCALC 135 (H) 01/15/2024 0916   LDLCALC 109 06/15/2023 1112   Hepatic Function Panel     Component Value Date/Time   PROT 6.9 01/15/2024 0916   ALBUMIN 4.1 01/15/2024 0916   AST 21 01/15/2024 0916   ALT 20 01/15/2024 0916   ALKPHOS 77 01/15/2024 0916   BILITOT <0.2 01/15/2024 0916      Component Value Date/Time   TSH 1.960 01/15/2024 0916   Nutritional Lab Results  Component  Value Date   VD25OH 32.5 01/15/2024   VD25OH 30 06/15/2023   VD25OH 38 06/24/2022     Assessment and Plan Assessment & Plan Obesity Obesity management is ongoing with a focus on weight loss. She has been following a category three eating plan with 50% adherence and engaging in a variety of exercises, including cardio and strength training, four days a week for 60 minutes. She has lost four pounds in the last month, with a notable reduction in visceral fat. She is considering the use of a GLP-1 receptor agonist to accelerate weight loss due to her plans to join the [employer]. The potential benefits of GLP-1 include weight loss and improved blood sugar control. However, there are concerns about insurance coverage and the potential for decreased metabolism if caloric intake is too low. Insurance currently covers Wegovy , but this may change next month. Alternative options include Zepbound and Saxenda, with Saxenda potentially becoming more affordable due to a recent FDA approval for a generic version. - Prescribe Wegovy  and initiate prior authorization with insurance - Advise to obtain Wegovy  immediately if insurance approves - Discuss potential future options for GLP-1 receptor agonists if insurance coverage changes - Continue current exercise regimen and dietary plan  Acute gastroenteritis due to food poisoning with dehydration Recent episode of acute gastroenteritis due to food poisoning from [employer], leading to dehydration. Symptoms included nausea and vomiting, requiring hospital visit for IV fluids and anti-nausea medication. Symptoms have improved significantly, though some nausea and vomiting persist. Hydration status is improving but still slightly dehydrated. - Encourage continued hydration - Start back to more exercise after she feels fully recovered.  Vitamin D  deficiency Vitamin D  deficiency is being managed with supplementation. No current issues reported with vitamin D  intake. -  Refill vitamin D  prescription - Plan to recheck vitamin D  levels later in the fall     Judy King was informed of the importance of frequent follow up visits to maximize her success with intensive lifestyle modifications for her obesity and obesity related health conditions as recommended by USPSTF and CMS guidelines   Louann Penton, MD

## 2024-03-19 ENCOUNTER — Telehealth (INDEPENDENT_AMBULATORY_CARE_PROVIDER_SITE_OTHER): Payer: Self-pay

## 2024-03-19 NOTE — Telephone Encounter (Signed)
 Prior auth submitted for Wegovy , awaiting determination.

## 2024-03-19 NOTE — Telephone Encounter (Signed)
 Subject: Pharmacy prior authorization request number [id number] Regarding member: [id number]; Judy King; 2005/11/06 Dear Dr. Louann Penton:  [facility] reviewed your request for WEGOVY  0.25 MG/0.5 ML PEN for the above-identified  member, and it is approved as follows:  WEGOVY  0.25 MG/0.5 ML PEN: quantity/billable units of 2  approved for 03/18/2024- 09/14/2024.  Patient has been notified via my chart.

## 2024-04-16 ENCOUNTER — Encounter (INDEPENDENT_AMBULATORY_CARE_PROVIDER_SITE_OTHER): Payer: Self-pay | Admitting: Family Medicine

## 2024-04-16 ENCOUNTER — Ambulatory Visit (INDEPENDENT_AMBULATORY_CARE_PROVIDER_SITE_OTHER): Admitting: Family Medicine

## 2024-04-16 VITALS — BP 124/76 | HR 73 | Temp 98.1°F | Ht 65.0 in | Wt 311.0 lb

## 2024-04-16 DIAGNOSIS — E559 Vitamin D deficiency, unspecified: Secondary | ICD-10-CM | POA: Diagnosis not present

## 2024-04-16 DIAGNOSIS — R7303 Prediabetes: Secondary | ICD-10-CM

## 2024-04-16 DIAGNOSIS — Z6841 Body Mass Index (BMI) 40.0 and over, adult: Secondary | ICD-10-CM

## 2024-04-16 MED ORDER — WEGOVY 0.25 MG/0.5ML ~~LOC~~ SOAJ: 0.2500 mg | SUBCUTANEOUS | 0 refills | Status: DC

## 2024-04-16 NOTE — Progress Notes (Signed)
 Judy King, D.O.  ABFM, ABOM Specializing in Clinical Bariatric Medicine  Office located at: [address] [place], [place]  [address]    Obtain fasting labs next OV.  A) FOR THE CHRONIC DISEASE OF OBESITY:  Chief complaint: Obesity Judy King is here to discuss her progress with her obesity treatment plan.   History of present illness / Interval history:  Judy King is here today for her follow-up office visit.  Since last OV on 03/14/24, pt is down 4 lbs. Pt states that she is consistently moving due to her job. She says that she eats 2 meals. She ate an egg sandwich in the morning and a strawberry greek yogurt for dinner one day.      03/14/24 04/16/24 10:00   Body Fat % 47.8 % 47.6 %  Muscle Mass (lbs) 156.4 lbs 155.2 lbs  Fat Mass (lbs) 150.8 lbs 148.4 lbs  Total Body Water (lbs) 110 lbs 109 lbs  Visceral Fat Rating  15 14    Counseling done on how various foods will affect these numbers and how to maximize success   - She has lost 23.6 lbs of fat since she has started and gained 13.6 lbs in muscles.   Total lbs lost to date: - 10 lbs Total Fat Mass in lbs lost to date: - 18 lbs Total weight loss percentage to date: - 3.12 %    BMI 50.0-59.9, adult (HCC) current Morbid obesity Starting [facility]) 53.4  Nutrition Therapy She is on the Category 3 Plan and states she is following her eating plan approximately 0 % of the time.   - Tracking Calories/Macros: yes  - Eating More Whole Foods: yes  - Adequate Protein Intake: no   - Adequate Water Intake: yes  - Skipping Meals: yes  - Sleeping 7-9 Hours/ Night: yes   [name] is currently in the action stage of change. As such, her goal is to continue weight management plan.  She has agreed to: switch to Cat 2 MP w B/L options or journal 1400 with 90+ g protein    Physical Activity Alex is running or lifting 60-90  minutes 7 days per week   Lilibeth has been advised to work up to 300-450  minutes of moderate intensity aerobic activity a week and strengthening exercises 2-3 times per week for cardiovascular health, weight loss maintenance and preservation of muscle mass.  She has agreed to : Increase physical activity in their day and reduce sedentary time (increase NEAT)., Increase volume of physical activity to a goal of 240 minutes a week, and Combine aerobic and strengthening exercises for efficiency and improved cardiometabolic health.   Behavioral Modifications Evidence-based interventions for health behavior change were utilized today including the discussion of  1) self monitoring techniques:    2) problem-solving barriers:    3) self care:    4) SMART goals for next OV:    - Focus on eating something every 2-3 hours  Regarding patient's less desirable eating habits and patterns, we employed the technique of small changes.   We discussed the following today: increasing lean protein intake to established goals, decreasing simple carbohydrates , avoiding skipping meals, and planning for success Additional resources provided today: Handout on CAT 2 meal plan and Handout on CAT 1-2 breakfast options   Medical Interventions/ Pharmacotherapy Previous Bariatric surgery: n/a Pharmacotherapy for weight loss: She is currently taking Wegovy  0.25 mg and Wellbutrin  XL 300 mg daily for medical weight loss.    -  Pt was placed on Wegovy  0.25 mg last OV. Good compliance and tolerance.  Pt states that she doesn't feel hungry anymore. She used to be hungry in the morning and now is not hungry and she tries to force herself to eat breakfast between her first and second class. Pt denies any GI upset or nausea. Pt states that her insurance covered the first month of Wegovy  but it will not cover any other dose. She was placed on Wegovy  as a trial. Will refill today.     We discussed various medication options to help Judy King with her weight loss efforts and we both agreed to : Continue  with current nutritional and behavioral strategies   B) OBESITY RELATED CONDITIONS ADDRESSED TODAY:  Prediabetes Assessment & Plan Lab Results  Component Value Date   HGBA1C 5.3 01/15/2024   HGBA1C 5.7 (H) 06/15/2023   HGBA1C 5.6 06/24/2022   INSULIN  37.7 (H) 01/15/2024    Pt is not taking any medications to help manage her prediabetes. Life and diet controlled. Pt states that she has good control of hunger and cravings. She endorses struggling to get her food in some days now that she has started her Wegovy . Recommended pt to try to eat every couple of hours. Continue with nutritional meal plan and decreasing simple carbs and sugars.      Vitamin D  deficiency Assessment & Plan Lab Results  Component Value Date   VD25OH 32.5 01/15/2024   VD25OH 30 06/15/2023   VD25OH 38 06/24/2022   ERGO 50 K with good compliance and tolerance. No acute concerns today. Continue supplementation. Will obtain labs as medically necessary.     Medications Discontinued During This Encounter  Medication Reason   semaglutide -weight management (WEGOVY ) 0.25 MG/0.5ML SOAJ SQ injection Reorder     Meds ordered this encounter  Medications   semaglutide -weight management (WEGOVY ) 0.25 MG/0.5ML SOAJ SQ injection    Sig: Inject 0.25 mg into the skin once a week.    Dispense:  2 mL    Refill:  0      Follow up:   Return 06/18/2024 at 10:40 AM  She was informed of the importance of frequent follow up visits to maximize her success with intensive lifestyle modifications for her multiple health conditions.   Weight Summary and Biometrics   Weight Lost Since Last Visit: 4lb  Weight Gained Since Last Visit: 0lb    Vitals Temp: 98.1 F (36.7 C) BP: 124/76 Pulse Rate: 73 SpO2: 100 %   Anthropometric Measurements Height: 5' 5 (1.651 m) Weight: (!) 311 lb (141.1 kg) BMI (Calculated): 51.75 Weight at Last Visit: 315lb Weight Lost Since Last Visit: 4lb Weight Gained Since Last Visit:  0lb Starting Weight: 321lb Total Weight Loss (lbs): 10 lb (4.536 kg)   Body Composition  Body Fat %: 47.6 % Fat Mass (lbs): 148.4 lbs Muscle Mass (lbs): 155.2 lbs Total Body Water (lbs): 109 lbs Visceral Fat Rating : 14   Other Clinical Data Fasting: No Labs: No Today's Visit #: 5 Starting Date: 01/15/24    Objective:   PHYSICAL EXAM: Blood pressure 124/76, pulse 73, temperature 98.1 F (36.7 C), height 5' 5 (1.651 m), weight (!) 311 lb (141.1 kg), last menstrual period 04/02/2024, SpO2 100%. Body mass index is 51.75 kg/m.  General: she is overweight, cooperative and in no acute distress. PSYCH: Has normal mood, affect and thought process.   HEENT: EOMI, sclerae are anicteric. Lungs: Normal breathing effort, no conversational dyspnea. Extremities: Moves * 4 Neurologic: A  and O * 3, good insight  DIAGNOSTIC DATA REVIEWED: BMET    Component Value Date/Time   NA 140 01/15/2024 0916   K 4.4 01/15/2024 0916   CL 105 01/15/2024 0916   CO2 16 (L) 01/15/2024 0916   GLUCOSE 84 01/15/2024 0916   GLUCOSE 90 06/15/2023 1112   BUN 10 01/15/2024 0916   CREATININE 0.71 01/15/2024 0916   CREATININE 0.64 06/15/2023 1112   CALCIUM 9.7 01/15/2024 0916   GFRNONAA NOT CALCULATED 07/11/2020 0306   Lab Results  Component Value Date   HGBA1C 5.3 01/15/2024   HGBA1C 5.2 12/09/2019   Lab Results  Component Value Date   INSULIN  37.7 (H) 01/15/2024   Lab Results  Component Value Date   TSH 1.960 01/15/2024   CBC    Component Value Date/Time   WBC 8.4 01/15/2024 0916   WBC 9.2 06/15/2023 1112   RBC 4.44 01/15/2024 0916   RBC 4.47 06/15/2023 1112   HGB 11.9 01/15/2024 0916   HCT 39.4 01/15/2024 0916   PLT 475 (H) 01/15/2024 0916   MCV 89 01/15/2024 0916   MCH 26.8 01/15/2024 0916   MCH 26.8 06/15/2023 1112   MCHC 30.2 (L) 01/15/2024 0916   MCHC 31.8 06/15/2023 1112   RDW 14.0 01/15/2024 0916   Iron Studies No results found for: IRON, TIBC, FERRITIN,  IRONPCTSAT Lipid Panel     Component Value Date/Time   CHOL 200 (H) 01/15/2024 0916   TRIG 94 (H) 01/15/2024 0916   HDL 48 01/15/2024 0916   CHOLHDL 4.2 01/15/2024 0916   CHOLHDL 3.7 06/15/2023 1112   LDLCALC 135 (H) 01/15/2024 0916   LDLCALC 109 06/15/2023 1112   Hepatic Function Panel     Component Value Date/Time   PROT 6.9 01/15/2024 0916   ALBUMIN 4.1 01/15/2024 0916   AST 21 01/15/2024 0916   ALT 20 01/15/2024 0916   ALKPHOS 77 01/15/2024 0916   BILITOT <0.2 01/15/2024 0916      Component Value Date/Time   TSH 1.960 01/15/2024 0916   Nutritional Lab Results  Component Value Date   VD25OH 32.5 01/15/2024   VD25OH 30 06/15/2023   VD25OH 38 06/24/2022    Attestations:   I, Sonny Laroche, acting as a Stage manager for Judy Jenkins, DO., have compiled all relevant documentation for today's office visit on behalf of Judy Jenkins, DO, while in the presence of [employer], DO.    I have reviewed the above documentation for accuracy and completeness, and I agree with the above. Judy JINNY King, D.O.  The 21st Century Cures Act was signed into law in 2016 which includes the topic of electronic health records.  This provides immediate access to information in MyChart.  This includes consultation notes, operative notes, office notes, lab results and pathology reports.  If you have any questions about what you read please let us  know at your next visit so we can discuss your concerns and take corrective action if need be.  We are right here with you.

## 2024-04-18 ENCOUNTER — Telehealth (INDEPENDENT_AMBULATORY_CARE_PROVIDER_SITE_OTHER): Payer: Self-pay

## 2024-04-18 NOTE — Telephone Encounter (Signed)
 Need notes for PA

## 2024-04-24 NOTE — Telephone Encounter (Signed)
 PA for Wegovy  0.25MG  has been submitted, awaiting PA questions.   PA questions for Wegovy  0.25MG   have been answered and all documentation has been included. Waiting on a determination.

## 2024-04-29 NOTE — Telephone Encounter (Signed)
 PA for Wegovy  0.25 has been approved. PA is now complete.     Your prior authorization for Wegovy  has been approved! More Info Personalized support and financial assistance may be available through the [employer] program. For more information, and to see program requirements, click on the More Info button to the right.  Message from plan: PA Case: [id number], Status: Approved, Coverage Starts on: 04/25/2024 12:00:00 AM, Coverage Ends on: 10/22/2024 12:00:00 AM.. Authorization Expiration Date: October 22, 2024.

## 2024-06-18 ENCOUNTER — Ambulatory Visit (INDEPENDENT_AMBULATORY_CARE_PROVIDER_SITE_OTHER): Payer: Self-pay | Admitting: Family Medicine

## 2024-06-18 ENCOUNTER — Encounter (INDEPENDENT_AMBULATORY_CARE_PROVIDER_SITE_OTHER): Payer: Self-pay | Admitting: Family Medicine

## 2024-06-18 DIAGNOSIS — E782 Mixed hyperlipidemia: Secondary | ICD-10-CM

## 2024-06-18 DIAGNOSIS — E559 Vitamin D deficiency, unspecified: Secondary | ICD-10-CM

## 2024-06-18 DIAGNOSIS — Z6841 Body Mass Index (BMI) 40.0 and over, adult: Secondary | ICD-10-CM

## 2024-06-18 DIAGNOSIS — E038 Other specified hypothyroidism: Secondary | ICD-10-CM

## 2024-06-18 DIAGNOSIS — E538 Deficiency of other specified B group vitamins: Secondary | ICD-10-CM | POA: Diagnosis not present

## 2024-06-18 DIAGNOSIS — R7303 Prediabetes: Secondary | ICD-10-CM | POA: Diagnosis not present

## 2024-06-18 DIAGNOSIS — E65 Localized adiposity: Secondary | ICD-10-CM | POA: Insufficient documentation

## 2024-06-18 MED ORDER — WEGOVY 0.5 MG/0.5ML ~~LOC~~ SOAJ: 0.5000 mg | SUBCUTANEOUS | 0 refills | Status: DC

## 2024-06-18 MED ORDER — VITAMIN D (ERGOCALCIFEROL) 1.25 MG (50000 UNIT) PO CAPS: 50000.0000 [IU] | ORAL_CAPSULE | ORAL | 0 refills | Status: DC

## 2024-06-18 NOTE — Progress Notes (Signed)
 Judy King, D.O.  ABFM, ABOM Specializing in Clinical Bariatric Medicine  Office located at: [address] [place], [place]  [address]      A) FOR THE CHRONIC DISEASE OF OBESITY:  Chief complaint: Obesity Judy King is here to discuss her progress with her obesity treatment plan.   History of present illness / Interval history:  Judy King is here today for her follow-up office visit.  Since last OV on 04/16/24, pt is up 6 lbs. Patient states that she is eating more proteins and more fruits than veggies. She endorses being stressed due to finals.    04/16/24 10:00 06/18/24 10:00   Body Fat % 47.6 % 51.1 %  Muscle Mass (lbs) 155.2 lbs 147.6 lbs  Fat Mass (lbs) 148.4 lbs 162.4 lbs  Total Body Water (lbs) 109 lbs 106.4 lbs  Visceral Fat Rating  14 16   Counseling done on how various foods will affect these numbers and how to maximize success.  Total lbs lost to date: - 4 lbs Total Fat Mass in lbs lost to date: - 4 lbs Total weight loss percentage to date: - 1.25 %    Morbid obesity Starting (HCC) 53.4 BMI 50.0-59.9, adult (HCC) 52.75  Nutrition Therapy She is on Cat 2 MP w B/L options or journal 1400 with 90+ g protein and states she is following her eating plan approximately 75 % of the time.   - Tracking Calories/Macros: yes  - Eating More Whole Foods: yes  - Adequate Protein Intake: yes  - Adequate Water Intake: yes  - Skipping Meals: yes  - Sleeping 7-9 Hours/ Night: yes   Judy King is currently in the action stage of change. As such, her goal is to continue weight management plan.  She has agreed to: continue current plan   Physical Activity [name] is doing various cardio and strength training activities 60  minutes 4 days per week   Judy King has been advised to work up to 300-450 minutes of moderate intensity aerobic activity a week and strengthening exercises 2-3 times per week for cardiovascular health, weight loss maintenance and  preservation of muscle mass.  She has agreed to : Increase volume of physical activity to a goal of 240 minutes a week and Combine aerobic and strengthening exercises for efficiency and improved cardiometabolic health.   Behavioral Modifications Evidence-based interventions for health behavior change were utilized today including the discussion of   1) SMART goals for next OV:    - Journal every day and bring in log  Regarding patient's less desirable eating habits and patterns, we employed the technique of small changes.   We discussed the following today: increasing lean protein intake to established goals, avoiding skipping meals, increasing water intake , keeping healthy foods at home, continue to work on implementation of reduced calorie nutritional plan, planning for success, and staying on track while traveling and vacationing Additional resources provided today: Handout on Daily Food Journaling Log   Medical Interventions/ Pharmacotherapy Previous Bariatric surgery: n/a Pharmacotherapy for weight loss: She is currently taking Wegovy  0.25 mg weekly and Wellbutrin  XL 300 mg daily for medical weight loss.    On Wegovy  0.25 mg once weekly. With reported good compliance and tolerance. Patient states that she has nausea the first 2 days after she takes the shot but it then goes away. She endorses that when she is eating on plan that does not happen. She gives herself the shot on Thursdays. Due to weight gain even  though she is following the meal plan mutually agreed to Increase Wegovy  to 0.5 mg once weekly.    We discussed various medication options to help [facility] with her weight loss efforts and we both agreed to : Adequate clinical response to anti-obesity medication, continue current regimen   B) OBESITY RELATED CONDITIONS ADDRESSED TODAY:  Prediabetes Assessment & Plan Lab Results  Component Value Date   HGBA1C 5.3 01/15/2024   HGBA1C 5.7 (H) 06/15/2023   HGBA1C 5.6  06/24/2022   INSULIN  37.7 (H) 01/15/2024    On Wegovy  0.25 mg once weekly. With reported good compliance and tolerance. No complaints of excessive hunger and cravings. Reminded patient of the importance of eating on plan. Continue following meal plan. Will obtain labs today and review and next OV.     Vitamin D  deficiency Assessment & Plan Lab Results  Component Value Date   VD25OH 32.5 01/15/2024   VD25OH 30 06/15/2023   VD25OH 38 06/24/2022   Taking ERGO 50K units once weekly. With reported good compliance and tolerance. Patient denies having any issues remembering to take her supplements or GI upsets. Continue with supplementation will refill today.     Mixed hyperlipidemia Assessment & Plan Lab Results  Component Value Date   CHOL 200 (H) 01/15/2024   HDL 48 01/15/2024   LDLCALC 135 (H) 01/15/2024   TRIG 94 (H) 01/15/2024   CHOLHDL 4.2 01/15/2024   Diet and life style controlled. Patients previous labs showed elevated Trig and LDL levels. As patient follows prudent meal plan and decreases fatty and fried foods these levels will decrease. Will recheck levels today and review at next OV.     Subclinical hypothyroidism - new onset Assessment & Plan Lab Results  Component Value Date   TSH 1.960 01/15/2024   T3TOTAL 140 01/15/2024   FREET4 0.87 (L) 01/15/2024   Patient states that since these results were reviewed she has not followed up with a PCP. She is waiting to see a PCP since she has aged out of her pediatrician office. Will obtain labs today and review them at next OV.    B12 deficiency - new onset Assessment & Plan Lab Results  Component Value Date   VITAMINB12 310 01/15/2024   Patient has not been taking B12 supplements but focusing on B12 rich foods. Will recheck levels today. Reminded patient of the importance of at goal B12 levels. Will review at next OV.    Visceral obesity Assessment & Plan Current visceral fat rating: 16.  The visceral fat  rating should be < 12 in a female.  Visceral adipose tissue is a hormonally active component of total body fat. This body composition phenotype is associated with medical disorders such as metabolic syndrome, cardiovascular disease and several malignancies including prostate, breast, and colorectal cancers. Lose 7-10% of weight via prudent nutritional plan and lifestyle changes.     Medications Discontinued During This Encounter  Medication Reason   semaglutide -weight management (WEGOVY ) 0.25 MG/0.5ML SOAJ SQ injection Dose change   Vitamin D , Ergocalciferol , (DRISDOL ) 1.25 MG (50000 UNIT) CAPS capsule Reorder     Meds ordered this encounter  Medications   Vitamin D , Ergocalciferol , (DRISDOL ) 1.25 MG (50000 UNIT) CAPS capsule    Sig: Take 1 capsule (50,000 Units total) by mouth every 7 (seven) days.    Dispense:  4 capsule    Refill:  0   semaglutide -weight management (WEGOVY ) 0.5 MG/0.5ML SOAJ SQ injection    Sig: Inject 0.5 mg into the skin once a  week.    Dispense:  2 mL    Refill:  0      Follow up:   Return 07/08/2024 at 8:30 AM  She was informed of the importance of frequent follow up visits to maximize her success with intensive lifestyle modifications for her multiple health conditions.   Weight Summary and Biometrics   Weight Lost Since Last Visit: 0lb  Weight Gained Since Last Visit: 6lb   Vitals Temp: 98 F (36.7 C) BP: 117/78 Pulse Rate: 80 SpO2: 99 %   Anthropometric Measurements Height: 5' 5 (1.651 m) Weight: (!) 317 lb (143.8 kg) BMI (Calculated): 52.75 Weight at Last Visit: 311lb Weight Lost Since Last Visit: 0lb Weight Gained Since Last Visit: 6lb Starting Weight: 321lb Total Weight Loss (lbs): 4 lb (1.814 kg)   Body Composition  Body Fat %: 51.1 % Fat Mass (lbs): 162.4 lbs Muscle Mass (lbs): 147.6 lbs Total Body Water (lbs): 106.4 lbs Visceral Fat Rating : 16   Other Clinical Data Fasting: Yes Labs: Yes Today's Visit #: 6 Starting  Date: 01/15/24    Objective:   PHYSICAL EXAM: Blood pressure 117/78, pulse 80, temperature 98 F (36.7 C), height 5' 5 (1.651 m), weight (!) 317 lb (143.8 kg), last menstrual period 06/17/2024, SpO2 99%. Body mass index is 52.75 kg/m.  General: she is overweight, cooperative and in no acute distress. PSYCH: Has normal mood, affect and thought process.   HEENT: EOMI, sclerae are anicteric. Lungs: Normal breathing effort, no conversational dyspnea. Extremities: Moves * 4 Neurologic: A and O * 3, good insight  DIAGNOSTIC DATA REVIEWED: BMET    Component Value Date/Time   NA 140 01/15/2024 0916   K 4.4 01/15/2024 0916   CL 105 01/15/2024 0916   CO2 16 (L) 01/15/2024 0916   GLUCOSE 84 01/15/2024 0916   GLUCOSE 90 06/15/2023 1112   BUN 10 01/15/2024 0916   CREATININE 0.71 01/15/2024 0916   CREATININE 0.64 06/15/2023 1112   CALCIUM 9.7 01/15/2024 0916   GFRNONAA NOT CALCULATED 07/11/2020 0306   Lab Results  Component Value Date   HGBA1C 5.3 01/15/2024   HGBA1C 5.2 12/09/2019   Lab Results  Component Value Date   INSULIN  37.7 (H) 01/15/2024   Lab Results  Component Value Date   TSH 1.960 01/15/2024   CBC    Component Value Date/Time   WBC 8.4 01/15/2024 0916   WBC 9.2 06/15/2023 1112   RBC 4.44 01/15/2024 0916   RBC 4.47 06/15/2023 1112   HGB 11.9 01/15/2024 0916   HCT 39.4 01/15/2024 0916   PLT 475 (H) 01/15/2024 0916   MCV 89 01/15/2024 0916   MCH 26.8 01/15/2024 0916   MCH 26.8 06/15/2023 1112   MCHC 30.2 (L) 01/15/2024 0916   MCHC 31.8 06/15/2023 1112   RDW 14.0 01/15/2024 0916   Iron Studies No results found for: IRON, TIBC, FERRITIN, IRONPCTSAT Lipid Panel     Component Value Date/Time   CHOL 200 (H) 01/15/2024 0916   TRIG 94 (H) 01/15/2024 0916   HDL 48 01/15/2024 0916   CHOLHDL 4.2 01/15/2024 0916   CHOLHDL 3.7 06/15/2023 1112   LDLCALC 135 (H) 01/15/2024 0916   LDLCALC 109 06/15/2023 1112   Hepatic Function Panel     Component  Value Date/Time   PROT 6.9 01/15/2024 0916   ALBUMIN 4.1 01/15/2024 0916   AST 21 01/15/2024 0916   ALT 20 01/15/2024 0916   ALKPHOS 77 01/15/2024 0916   BILITOT <0.2 01/15/2024 9083  Component Value Date/Time   TSH 1.960 01/15/2024 0916   Nutritional Lab Results  Component Value Date   VD25OH 32.5 01/15/2024   VD25OH 30 06/15/2023   VD25OH 38 06/24/2022    Attestations:   I, Sonny Laroche, acting as a medical scribe for Judy Jenkins, DO., have compiled all relevant documentation for today's office visit on behalf of Judy Jenkins, DO, while in the presence of [employer], DO.   I have reviewed the above documentation for accuracy and completeness, and I agree with the above. Judy JINNY King, D.O.  The 21st Century Cures Act was signed into law in 2016 which includes the topic of electronic health records.  This provides immediate access to information in MyChart.  This includes consultation notes, operative notes, office notes, lab results and pathology reports.  If you have any questions about what you read please let us  know at your next visit so we can discuss your concerns and take corrective action if need be.  We are right here with you.

## 2024-06-19 LAB — COMPREHENSIVE METABOLIC PANEL WITH GFR
ALT: 20 IU/L (ref 0–32)
AST: 22 IU/L (ref 0–40)
Albumin: 4.5 g/dL (ref 4.0–5.0)
Alkaline Phosphatase: 80 IU/L (ref 42–106)
BUN/Creatinine Ratio: 13 (ref 9–23)
BUN: 10 mg/dL (ref 6–20)
Bilirubin Total: 0.3 mg/dL (ref 0.0–1.2)
CO2: 21 mmol/L (ref 20–29)
Calcium: 9.7 mg/dL (ref 8.7–10.2)
Chloride: 102 mmol/L (ref 96–106)
Creatinine, Ser: 0.79 mg/dL (ref 0.57–1.00)
Globulin, Total: 2.8 g/dL (ref 1.5–4.5)
Glucose: 82 mg/dL (ref 70–99)
Potassium: 4.4 mmol/L (ref 3.5–5.2)
Sodium: 139 mmol/L (ref 134–144)
Total Protein: 7.3 g/dL (ref 6.0–8.5)
eGFR: 110 mL/min/1.73 (ref >59)

## 2024-06-19 LAB — T3: T3, Total: 96 ng/dL (ref 71–180)

## 2024-06-19 LAB — TSH: TSH: 1.52 u[IU]/mL (ref 0.450–4.500)

## 2024-06-19 LAB — HEMOGLOBIN A1C
Est. average glucose Bld gHb Est-mCnc: 111 mg/dL
Hgb A1c MFr Bld: 5.5 % (ref 4.8–5.6)

## 2024-06-19 LAB — LIPID PANEL
Chol/HDL Ratio: 4.2 ratio (ref 0.0–4.4)
Cholesterol, Total: 221 mg/dL — ABNORMAL HIGH (ref 100–169)
HDL: 53 mg/dL (ref >39)
LDL Chol Calc (NIH): 143 mg/dL — ABNORMAL HIGH (ref 0–109)
Triglycerides: 141 mg/dL — ABNORMAL HIGH (ref 0–89)
VLDL Cholesterol Cal: 25 mg/dL (ref 5–40)

## 2024-06-19 LAB — VITAMIN D 25 HYDROXY (VIT D DEFICIENCY, FRACTURES): Vit D, 25-Hydroxy: 27.8 ng/mL — ABNORMAL LOW (ref 30.0–100.0)

## 2024-06-19 LAB — T4, FREE: Free T4: 0.85 ng/dL — ABNORMAL LOW (ref 0.93–1.60)

## 2024-06-19 LAB — VITAMIN B12: Vitamin B-12: 369 pg/mL (ref 232–1245)

## 2024-06-19 LAB — INSULIN, RANDOM: INSULIN: 41 u[IU]/mL — ABNORMAL HIGH (ref 2.6–24.9)

## 2024-06-25 ENCOUNTER — Ambulatory Visit (INDEPENDENT_AMBULATORY_CARE_PROVIDER_SITE_OTHER): Admitting: Nurse Practitioner

## 2024-07-08 ENCOUNTER — Encounter (INDEPENDENT_AMBULATORY_CARE_PROVIDER_SITE_OTHER): Payer: Self-pay | Admitting: Nurse Practitioner

## 2024-07-08 ENCOUNTER — Ambulatory Visit (INDEPENDENT_AMBULATORY_CARE_PROVIDER_SITE_OTHER): Admitting: Nurse Practitioner

## 2024-07-08 VITALS — BP 133/85 | HR 88 | Temp 98.2°F | Ht 65.0 in | Wt 325.0 lb

## 2024-07-08 DIAGNOSIS — E782 Mixed hyperlipidemia: Secondary | ICD-10-CM

## 2024-07-08 DIAGNOSIS — E559 Vitamin D deficiency, unspecified: Secondary | ICD-10-CM

## 2024-07-08 DIAGNOSIS — Z6841 Body Mass Index (BMI) 40.0 and over, adult: Secondary | ICD-10-CM

## 2024-07-08 DIAGNOSIS — R7303 Prediabetes: Secondary | ICD-10-CM | POA: Diagnosis not present

## 2024-07-08 DIAGNOSIS — E66813 Obesity, class 3: Secondary | ICD-10-CM

## 2024-07-08 MED ORDER — WEGOVY 0.5 MG/0.5ML ~~LOC~~ SOAJ: 0.5000 mg | SUBCUTANEOUS | 1 refills | Status: AC

## 2024-07-08 MED ORDER — VITAMIN D (ERGOCALCIFEROL) 1.25 MG (50000 UNIT) PO CAPS: ORAL_CAPSULE | ORAL | 0 refills | Status: DC

## 2024-07-08 MED ORDER — WEGOVY 0.5 MG/0.5ML ~~LOC~~ SOAJ: 0.5000 mg | SUBCUTANEOUS | 0 refills | Status: DC

## 2024-07-08 MED ORDER — VITAMIN D (ERGOCALCIFEROL) 1.25 MG (50000 UNIT) PO CAPS: ORAL_CAPSULE | ORAL | 1 refills | Status: AC

## 2024-07-08 NOTE — Progress Notes (Signed)
 " Office: [phone]  /  Fax: [phone]  WEIGHT SUMMARY AND BIOMETRICS  Weight Lost Since Last Visit: 0  Weight Gained Since Last Visit: 8 lb   Vitals Temp: 98.2 F (36.8 C) BP: 133/85 Pulse Rate: 88 SpO2: 100 %   Anthropometric Measurements Height: 5' 5 (1.651 m) Weight: (!) 325 lb (147.4 kg) BMI (Calculated): 54.08 Weight at Last Visit: 317 lb Weight Lost Since Last Visit: 0 Weight Gained Since Last Visit: 8 lb Starting Weight: 321 lb Total Weight Loss (lbs): 0 lb (0 kg)   Body Composition  Body Fat %: 52.4 % Fat Mass (lbs): 170.8 lbs Muscle Mass (lbs): 147.2 lbs Total Body Water (lbs): 109.4 lbs Visceral Fat Rating : 17   Other Clinical Data Fasting: Yes Labs: No Today's Visit #: 7 Starting Date: 01/15/24    Total Weight Loss: 0 pounds Bio Impedance Data reviewed with patient:  HPI  Chief Complaint: OBESITY  Judy King is here to discuss her progress with her obesity treatment plan. She is on the the Category 2 Plan and states she is following her eating plan approximately 75 % of the time. She states she is exercising 60 minutes 4 days per week.   Interval History:  Since last office visit she did notice she tried to stick to plan over Christmas. She was skipping meals over Christmas.  She will get only 1 meal some days.  Chicken with brown rice and broccoli.  Breakfast: Scrambled eggs with greek yogurt and high protein granola She is drinking almost  320 ounces of water daily- advised to decrease to 120 ounces daily She works for [employer] in [employer].    Pharmacotherapy for weight loss: She is currently taking Wegovy  0.5 mg SQ QW for medical weight loss.  Denies side effects.  She was unable to get her least script due to insurance but Medicaid rules changed and should be able to get prescription this month.  She continues on Ergocalciferol  50000 units once a week for Vit D deficiency and denies side effects with the medication.  Last  vitamin D  Lab Results  Component Value Date   VD25OH 27.8 (L) 06/18/2024    Lab results are reviewed with patient in detail.  Normal electrolytes, fasting glucose, kidney functions,  liver enzymes, Vit B12, A1c, TSH. Insulin  remains elevated- on Wegovy  0.5 mg SQ QW. Vit d remains low. Lipid panel shows mixed hyperlipidemia.    PHYSICAL EXAM:  Blood pressure 133/85, pulse 88, temperature 98.2 F (36.8 C), height 5' 5 (1.651 m), weight (!) 325 lb (147.4 kg), last menstrual period 06/17/2024, SpO2 100%. Body mass index is 54.08 kg/m.  General: Well Developed, well nourished, and in no acute distress.  HEENT: Normocephalic, atraumatic; EOMI, sclerae are anicteric. Skin: Warm and dry, good turgor Chest:  Normal excursion, shape, no gross ABN Respiratory: No conversational dyspnea; speaking in full sentences NeuroM-Sk:  Normal gross ROM * 4 extremities  Psych: A and O X 3, insight adequate, mood- full    DIAGNOSTIC DATA REVIEWED:  BMET    Component Value Date/Time   NA 139 06/18/2024 1149   K 4.4 06/18/2024 1149   CL 102 06/18/2024 1149   CO2 21 06/18/2024 1149   GLUCOSE 82 06/18/2024 1149   GLUCOSE 90 06/15/2023 1112   BUN 10 06/18/2024 1149   CREATININE 0.79 06/18/2024 1149   CREATININE 0.64 06/15/2023 1112   CALCIUM 9.7 06/18/2024 1149   GFRNONAA NOT CALCULATED 07/11/2020 0306   Lab Results  Component Value  Date   HGBA1C 5.5 06/18/2024   HGBA1C 5.2 12/09/2019   Lab Results  Component Value Date   INSULIN  41.0 (H) 06/18/2024   INSULIN  37.7 (H) 01/15/2024   Lab Results  Component Value Date   TSH 1.520 06/18/2024   CBC    Component Value Date/Time   WBC 8.4 01/15/2024 0916   WBC 9.2 06/15/2023 1112   RBC 4.44 01/15/2024 0916   RBC 4.47 06/15/2023 1112   HGB 11.9 01/15/2024 0916   HCT 39.4 01/15/2024 0916   PLT 475 (H) 01/15/2024 0916   MCV 89 01/15/2024 0916   MCH 26.8 01/15/2024 0916   MCH 26.8 06/15/2023 1112   MCHC 30.2 (L) 01/15/2024 0916   MCHC  31.8 06/15/2023 1112   RDW 14.0 01/15/2024 0916   Iron Studies No results found for: IRON, TIBC, FERRITIN, IRONPCTSAT Lipid Panel     Component Value Date/Time   CHOL 221 (H) 06/18/2024 1149   TRIG 141 (H) 06/18/2024 1149   HDL 53 06/18/2024 1149   CHOLHDL 4.2 06/18/2024 1149   CHOLHDL 3.7 06/15/2023 1112   LDLCALC 143 (H) 06/18/2024 1149   LDLCALC 109 06/15/2023 1112   Hepatic Function Panel     Component Value Date/Time   PROT 7.3 06/18/2024 1149   ALBUMIN 4.5 06/18/2024 1149   AST 22 06/18/2024 1149   ALT 20 06/18/2024 1149   ALKPHOS 80 06/18/2024 1149   BILITOT 0.3 06/18/2024 1149      Component Value Date/Time   TSH 1.520 06/18/2024 1149   Nutritional Lab Results  Component Value Date   VD25OH 27.8 (L) 06/18/2024   VD25OH 32.5 01/15/2024   VD25OH 30 06/15/2023     ASSESSMENT AND PLAN Class 3 severe obesity with serious comorbidity and body mass index (BMI) of 50.0 to 59.9 in adult, unspecified obesity type (HCC) TREATMENT PLAN FOR OBESITY:  Recommended Dietary Goals  Meilyn is currently in the action stage of change. As such, her goal is to continue weight management plan. She has agreed to the Category 2 Plan.  Behavioral Intervention  We discussed the following Behavioral Modification Strategies today: increasing lean protein intake to established goals, increasing fiber rich foods, avoiding skipping meals, continue to work on maintaining a reduced calorie state, getting the recommended amount of protein, incorporating whole foods, making healthy choices, staying well hydrated and practicing mindfulness when eating., and increase protein intake, fibrous foods (25 grams per day for women, 30 grams for men) and water to improve satiety and decrease hunger signals. Do not exceed 120 ounces of water daily- was drinking 320 ounces daily   Recommended Physical Activity Goals  Chanci has been advised to work up to 240 minutes of moderate intensity  aerobic activity a week and strengthening exercises 2-3 times per week for cardiovascular health, weight loss maintenance and preservation of muscle mass.   She has agreed to Continue current level of physical activity  and Combine aerobic and strengthening exercises for efficiency and improved cardiometabolic health.   Pharmacotherapy We discussed various medication options to help Eather with her weight loss efforts and we both agreed to Continue Wegovy  5 mg SQ QW- denies side effects.  ASSOCIATED CONDITIONS ADDRESSED TODAY  Action/Plan  Mixed hyperlipidemia Focus on implementing category 2 meal plan, limit saturated fats. Increase lean protein, fiber and water Focus on getting 150 minutes a week of moderate to high intensity exercise   Prediabetes Continue Category 2  meal plan, limit simple carbohydrates. Increase water, fiber and lean protein  Continue exercise with current goal of 240 minutes of moderate to high intensity exercise/week.   Vitamin D  deficiency Increase Vit D Ergocalciferol  50000 units to 1 cap every 3 days- remained low on once a week supplementation.  Denies side effects with medication.  -     Vitamin D  (Ergocalciferol ); Take 1 capsule by mouth every 3 day  Dispense: 10 capsule; Refill: 1  Class 3 severe obesity with serious comorbidity and body mass index (BMI) of 50.0 to 59.9 in adult, unspecified obesity type (HCC) See plan above -     Wegovy ; Inject 0.5 mg into the skin once a week.  Dispense: 2 mL; Refill: 1           Return in about 8 weeks (around 09/02/2024).Due to school. She was informed of the importance of frequent follow up visits to maximize her success with intensive lifestyle modifications for her multiple health conditions.   ATTESTASTION STATEMENTS:  Reviewed by clinician on day of visit: allergies, medications, problem list, medical history, surgical history, family history, social history, and previous encounter notes.     Lonell Liverpool ANP-C "

## 2024-09-09 ENCOUNTER — Ambulatory Visit (INDEPENDENT_AMBULATORY_CARE_PROVIDER_SITE_OTHER): Admitting: Family Medicine
